# Patient Record
Sex: Female | Born: 1995 | Race: Black or African American | Hispanic: No | Marital: Single | Smoking: Current some day smoker
Health system: Southern US, Community
[De-identification: ages and names within clinical notes are randomized; demographics above are authoritative.]

## PROBLEM LIST (undated history)

## (undated) DIAGNOSIS — M419 Scoliosis, unspecified: Secondary | ICD-10-CM

## (undated) DIAGNOSIS — W3400XA Accidental discharge from unspecified firearms or gun, initial encounter: Secondary | ICD-10-CM

## (undated) HISTORY — PX: KNEE SURGERY: SHX244

## (undated) HISTORY — PX: OTHER SURGICAL HISTORY: SHX169

---
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
## 2013-02-16 ENCOUNTER — Emergency Department (HOSPITAL_BASED_OUTPATIENT_CLINIC_OR_DEPARTMENT_OTHER)
Admission: EM | Admit: 2013-02-16 | Discharge: 2013-02-16 | Disposition: A | Discharge status: Home / Self Care | Payer: Medicaid Other | Attending: Emergency Medicine | Admitting: Emergency Medicine

## 2013-02-16 ENCOUNTER — Encounter (HOSPITAL_BASED_OUTPATIENT_CLINIC_OR_DEPARTMENT_OTHER): Payer: Self-pay | Admitting: *Deleted

## 2013-02-16 DIAGNOSIS — Z8739 Personal history of other diseases of the musculoskeletal system and connective tissue: Secondary | ICD-10-CM | POA: Insufficient documentation

## 2013-02-16 DIAGNOSIS — F17200 Nicotine dependence, unspecified, uncomplicated: Secondary | ICD-10-CM | POA: Insufficient documentation

## 2013-02-16 DIAGNOSIS — J209 Acute bronchitis, unspecified: Secondary | ICD-10-CM | POA: Insufficient documentation

## 2013-02-16 DIAGNOSIS — G43909 Migraine, unspecified, not intractable, without status migrainosus: Secondary | ICD-10-CM | POA: Insufficient documentation

## 2013-02-16 DIAGNOSIS — J45901 Unspecified asthma with (acute) exacerbation: Secondary | ICD-10-CM | POA: Insufficient documentation

## 2013-02-16 DIAGNOSIS — R072 Precordial pain: Secondary | ICD-10-CM | POA: Insufficient documentation

## 2013-02-16 MED ORDER — IPRATROPIUM BROMIDE 0.02 % IN SOLN
0.5000 mg | Freq: Once | RESPIRATORY_TRACT | Status: AC
  Administered 2013-02-16: 0.5 mg via RESPIRATORY_TRACT

## 2013-02-16 MED ORDER — ALBUTEROL SULFATE (5 MG/ML) 0.5% IN NEBU
INHALATION_SOLUTION | RESPIRATORY_TRACT | Status: AC
  Filled 2013-02-16: qty 1

## 2013-02-16 MED ORDER — HYDROCOD POLST-CHLORPHEN POLST 10-8 MG/5ML PO LQCR: 5.0000 mL | Freq: Two times a day (BID) | ORAL | Status: DC | PRN

## 2013-02-16 MED ORDER — IPRATROPIUM BROMIDE 0.02 % IN SOLN
RESPIRATORY_TRACT | Status: AC
  Filled 2013-02-16: qty 2.5

## 2013-02-16 MED ORDER — NAPROXEN 250 MG PO TABS
500.0000 mg | ORAL_TABLET | Freq: Once | ORAL | Status: AC
  Administered 2013-02-16: 500 mg via ORAL
  Filled 2013-02-16: qty 2

## 2013-02-16 MED ORDER — ALBUTEROL SULFATE (5 MG/ML) 0.5% IN NEBU
5.0000 mg | INHALATION_SOLUTION | Freq: Once | RESPIRATORY_TRACT | Status: AC
  Administered 2013-02-16: 5 mg via RESPIRATORY_TRACT

## 2013-02-16 MED ORDER — HYDROCOD POLST-CHLORPHEN POLST 10-8 MG/5ML PO LQCR
5.0000 mL | Freq: Once | ORAL | Status: AC
  Administered 2013-02-16: 5 mL via ORAL
  Filled 2013-02-16: qty 5

## 2013-02-16 NOTE — ED Provider Notes (Addendum)
CSN: [id number]     Arrival date & time 02/16/13  0049 History   First MD Initiated Contact with Patient 02/16/13 0100     Chief Complaint  Patient presents with  . Cough   (Consider location/radiation/quality/duration/timing/severity/associated sxs/prior Treatment) HPI Is a 17 year old female with a history of asthma. She is here with a one-week history of cold by which she means nasal congestion, fever and cough. The cold is also exacerbated her asthma and she has had more wheezing than usual for the past 3 days. The wheezing is control by her albuterol but she is here this morning because of excessive coughing. She has not taken anything specific for the coughing. She is also complaining of bilateral parasternal chest pain.  She is also had some fluttering in her chest. She has had this in the past was placed on a cardiac monitor which revealed no arrhythmia. She is not having fluttering at the present time.  Past Medical History  Diagnosis Date  . Migraine   . Asthma   . Scoliosis    History reviewed. No pertinent past surgical history. History reviewed. No pertinent family history. History  Substance Use Topics  . Smoking status: Current Some Day Smoker  . Smokeless tobacco: Not on file  . Alcohol Use: No   OB History   Grav Para Term Preterm Abortions TAB SAB Ect Mult Living                 Review of Systems  All other systems reviewed and are negative.    Allergies  Relpax  Home Medications   Current Outpatient Rx  Name  Route  Sig  Dispense  Refill  . ALBUTEROL IN   Inhalation   Inhale into the lungs.           . Beclomethasone Dipropionate (QVAR IN)   Inhalation   Inhale into the lungs.           Marland Kitchen HYDROcodone-acetaminophen (NORCO/VICODIN) 5-325 MG per tablet   Oral   Take 2 tablets by mouth every 4 (four) hours as needed for pain.   10 tablet   0   . Topiramate (TOPAMAX PO)   Oral   Take by mouth.            BP 111/68  Pulse 60   Temp(Src) 98.2 F (36.8 C)  Resp 20  Ht 5\' 8"  (1.727 m)  Wt 128 lb (58.06 kg)  BMI 19.47 kg/m2  SpO2 100%  LMP 01/16/2013  Physical Exam General: Well-developed, well-nourished female in no acute distress; appearance consistent with age of record HENT: normocephalic; atraumatic; nasal congestion Eyes: pupils equal, round and reactive to light; extraocular muscles intact Neck: supple Heart: regular rate and rhythm; no ectopy Lungs: clear to auscultation bilaterally Chest: Bilateral parasternal rib tenderness without deformity or crepitus Abdomen: soft; nondistended Extremities: No deformity; full range of motion; pulses normal Neurologic: Awake, alert and oriented; motor function intact in all extremities and symmetric; no facial droop Skin: Warm and dry Psychiatric: Flat affect    ED Course  Procedures (including critical care time)   MDM  Given albuterol and Atrovent and ED. she was advised to use ibuprofen or Aleve for her chest wall soreness.    Hanley Seamen, MD 02/16/13 0118  Hanley Seamen, MD 02/16/13 4098

## 2013-02-16 NOTE — ED Notes (Signed)
C/o cough, rib pain, and fever x 1 week.

## 2013-03-06 ENCOUNTER — Encounter (HOSPITAL_BASED_OUTPATIENT_CLINIC_OR_DEPARTMENT_OTHER): Payer: Self-pay | Admitting: Emergency Medicine

## 2013-03-06 ENCOUNTER — Emergency Department (HOSPITAL_BASED_OUTPATIENT_CLINIC_OR_DEPARTMENT_OTHER)
Admission: EM | Admit: 2013-03-06 | Discharge: 2013-03-06 | Disposition: A | Discharge status: Home / Self Care | Payer: Medicaid Other | Attending: Emergency Medicine | Admitting: Emergency Medicine

## 2013-03-06 DIAGNOSIS — Z8739 Personal history of other diseases of the musculoskeletal system and connective tissue: Secondary | ICD-10-CM | POA: Insufficient documentation

## 2013-03-06 DIAGNOSIS — J45909 Unspecified asthma, uncomplicated: Secondary | ICD-10-CM | POA: Insufficient documentation

## 2013-03-06 DIAGNOSIS — G43909 Migraine, unspecified, not intractable, without status migrainosus: Secondary | ICD-10-CM

## 2013-03-06 DIAGNOSIS — F17200 Nicotine dependence, unspecified, uncomplicated: Secondary | ICD-10-CM | POA: Insufficient documentation

## 2013-03-06 MED ORDER — DIPHENHYDRAMINE HCL 25 MG PO CAPS
50.0000 mg | ORAL_CAPSULE | Freq: Once | ORAL | Status: AC
  Administered 2013-03-06: 50 mg via ORAL
  Filled 2013-03-06: qty 2

## 2013-03-06 MED ORDER — METOCLOPRAMIDE HCL 5 MG/ML IJ SOLN
10.0000 mg | Freq: Once | INTRAMUSCULAR | Status: AC
  Administered 2013-03-06: 10 mg via INTRAMUSCULAR
  Filled 2013-03-06: qty 2

## 2013-03-06 MED ORDER — KETOROLAC TROMETHAMINE 60 MG/2ML IM SOLN
60.0000 mg | Freq: Once | INTRAMUSCULAR | Status: AC
  Administered 2013-03-06: 60 mg via INTRAMUSCULAR
  Filled 2013-03-06: qty 2

## 2013-03-06 NOTE — ED Notes (Signed)
Patient here with right temporal headache since am. Complains of nausea, dizziness and eye discomfort for same. Denies trauma. Alert and oriented

## 2013-03-06 NOTE — ED Provider Notes (Signed)
Medical screening examination/treatment/procedure(s) were performed by non-physician practitioner and as supervising physician I was immediately available for consultation/collaboration.    Gilda Crease, MD 03/06/13 1710

## 2013-03-06 NOTE — ED Provider Notes (Signed)
CSN: [id number]     Arrival date & time 03/06/13  1354 History   First MD Initiated Contact with Patient 03/06/13 1426     Chief Complaint  Patient presents with  . Headache   (Consider location/radiation/quality/duration/timing/severity/associated sxs/prior Treatment) Patient is a 17 y.o. female presenting with headaches. The history is provided by the patient. No language interpreter was used.  Headache Pain location:  R temporal Radiates to:  Does not radiate Severity currently:  8/10 Context: bright light   Associated symptoms: nausea and photophobia   Associated symptoms: no fever and no vomiting   Associated symptoms comment:  Right temporal headache since this morning. No vomiting, fever, visual change. No pain with eye movement. She reports a history of migraine headache and is taking Relpax without relief.    Past Medical History  Diagnosis Date  . Migraine   . Asthma   . Scoliosis    History reviewed. No pertinent past surgical history. No family history on file. History  Substance Use Topics  . Smoking status: Current Some Day Smoker  . Smokeless tobacco: Not on file  . Alcohol Use: No   OB History   Grav Para Term Preterm Abortions TAB SAB Ect Mult Living                 Review of Systems  Constitutional: Negative for fever and chills.  Eyes: Positive for photophobia.  Gastrointestinal: Positive for nausea. Negative for vomiting.  Musculoskeletal: Negative.   Skin: Negative.   Neurological: Positive for headaches.    Allergies  Relpax  Home Medications   Current Outpatient Rx  Name  Route  Sig  Dispense  Refill  . ALBUTEROL IN   Inhalation   Inhale into the lungs.           . Beclomethasone Dipropionate (QVAR IN)   Inhalation   Inhale into the lungs.           . Topiramate (TOPAMAX PO)   Oral   Take by mouth.            BP 106/64  Pulse 51  Temp(Src) 98 F (36.7 C) (Oral)  Resp 18  Ht 5\' 8"  (1.727 m)  Wt 129 lb (58.514 kg)   BMI 19.62 kg/m2  LMP 02/21/2013 Physical Exam  Constitutional: She is oriented to person, place, and time. She appears well-developed and well-nourished.  HENT:  Head: Normocephalic.  Eyes: Pupils are equal, round, and reactive to light.  Neck: Normal range of motion. Neck supple.  Cardiovascular: Normal rate and regular rhythm.   Pulmonary/Chest: Effort normal and breath sounds normal.  Abdominal: Soft. Bowel sounds are normal. There is no tenderness. There is no rebound and no guarding.  Musculoskeletal: Normal range of motion.  Neurological: She is alert and oriented to person, place, and time. She has normal strength and normal reflexes. No sensory deficit. She displays a negative Romberg sign. Coordination normal.  Cranial nerves 3-12 grossly intact. Ambulatory without difficulty.  Skin: Skin is warm and dry. No rash noted.  Psychiatric: She has a normal mood and affect.    ED Course  Procedures (including critical care time) Labs Review Labs Reviewed - No data to display Imaging Review No results found.  EKG Interpretation   None       MDM  No diagnosis found. 1. Migraine headache  Patient appears stable, texting on phone, NAD. VSS. Headache cocktail given. Stable for discharge.     Arnoldo Hooker, PA-C 03/06/13 1539

## 2015-09-11 ENCOUNTER — Encounter (HOSPITAL_BASED_OUTPATIENT_CLINIC_OR_DEPARTMENT_OTHER): Payer: Self-pay | Admitting: *Deleted

## 2015-09-11 ENCOUNTER — Emergency Department (HOSPITAL_BASED_OUTPATIENT_CLINIC_OR_DEPARTMENT_OTHER): Payer: Self-pay

## 2015-09-11 ENCOUNTER — Emergency Department (HOSPITAL_BASED_OUTPATIENT_CLINIC_OR_DEPARTMENT_OTHER)
Admission: EM | Admit: 2015-09-11 | Discharge: 2015-09-11 | Disposition: A | Discharge status: Home / Self Care | Payer: Self-pay | Attending: Emergency Medicine | Admitting: Emergency Medicine

## 2015-09-11 DIAGNOSIS — Y9389 Activity, other specified: Secondary | ICD-10-CM | POA: Insufficient documentation

## 2015-09-11 DIAGNOSIS — Y998 Other external cause status: Secondary | ICD-10-CM | POA: Insufficient documentation

## 2015-09-11 DIAGNOSIS — Y92410 Unspecified street and highway as the place of occurrence of the external cause: Secondary | ICD-10-CM | POA: Insufficient documentation

## 2015-09-11 DIAGNOSIS — S0990XA Unspecified injury of head, initial encounter: Secondary | ICD-10-CM

## 2015-09-11 DIAGNOSIS — M419 Scoliosis, unspecified: Secondary | ICD-10-CM | POA: Insufficient documentation

## 2015-09-11 DIAGNOSIS — Z8679 Personal history of other diseases of the circulatory system: Secondary | ICD-10-CM | POA: Insufficient documentation

## 2015-09-11 DIAGNOSIS — F17210 Nicotine dependence, cigarettes, uncomplicated: Secondary | ICD-10-CM | POA: Insufficient documentation

## 2015-09-11 DIAGNOSIS — J45909 Unspecified asthma, uncomplicated: Secondary | ICD-10-CM | POA: Insufficient documentation
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
## 2015-09-11 NOTE — Discharge Instructions (Signed)
Tylenol 1000 mg rotated with Motrin 600 mg every 4 hours as needed for pain.  Follow-up with your primary Dr. if not improving in the next week.   Concussion, Adult A concussion, or closed-head injury, is a brain injury caused by a direct blow to the head or by a quick and sudden movement (jolt) of the head or neck. Concussions are usually not life-threatening. Even so, the effects of a concussion can be serious. If you have had a concussion before, you are more likely to experience concussion-like symptoms after a direct blow to the head.  CAUSES  Direct blow to the head, such as from running into another player during a soccer game, being hit in a fight, or hitting your head on a hard surface.  A jolt of the head or neck that causes the brain to move back and forth inside the skull, such as in a car crash. SIGNS AND SYMPTOMS The signs of a concussion can be hard to notice. Early on, they may be missed by you, family members, and health care providers. You may look fine but act or feel differently. Symptoms are usually temporary, but they may last for days, weeks, or even longer. Some symptoms may appear right away while others may not show up for hours or days. Every head injury is different. Symptoms include:  Mild to moderate headaches that will not go away.  A feeling of pressure inside your head.  Having more trouble than usual:  Learning or remembering things you have heard.  Answering questions.  Paying attention or concentrating.  Organizing daily tasks.  Making decisions and solving problems.  Slowness in thinking, acting or reacting, speaking, or reading.  Getting lost or being easily confused.  Feeling tired all the time or lacking energy (fatigued).  Feeling drowsy.  Sleep disturbances.  Sleeping more than usual.  Sleeping less than usual.  Trouble falling asleep.  Trouble sleeping (insomnia).  Loss of balance or feeling lightheaded or dizzy.  Nausea  or vomiting.  Numbness or tingling.  Increased sensitivity to:  Sounds.  Lights.  Distractions.  Vision problems or eyes that tire easily.  Diminished sense of taste or smell.  Ringing in the ears.  Mood changes such as feeling sad or anxious.  Becoming easily irritated or angry for little or no reason.  Lack of motivation.  Seeing or hearing things other people do not see or hear (hallucinations). DIAGNOSIS Your health care provider can usually diagnose a concussion based on a description of your injury and symptoms. He or she will ask whether you passed out (lost consciousness) and whether you are having trouble remembering events that happened right before and during your injury. Your evaluation might include:  A brain scan to look for signs of injury to the brain. Even if the test shows no injury, you may still have a concussion.  Blood tests to be sure other problems are not present. TREATMENT  Concussions are usually treated in an emergency department, in urgent care, or at a clinic. You may need to stay in the hospital overnight for further treatment.  Tell your health care provider if you are taking any medicines, including prescription medicines, over-the-counter medicines, and natural remedies. Some medicines, such as blood thinners (anticoagulants) and aspirin, may increase the chance of complications. Also tell your health care provider whether you have had alcohol or are taking illegal drugs. This information may affect treatment.  Your health care provider will send you home with important instructions  to follow.  How fast you will recover from a concussion depends on many factors. These factors include how severe your concussion is, what part of your brain was injured, your age, and how healthy you were before the concussion.  Most people with mild injuries recover fully. Recovery can take time. In general, recovery is slower in older persons. Also, persons  who have had a concussion in the past or have other medical problems may find that it takes longer to recover from their current injury. HOME CARE INSTRUCTIONS General Instructions  Carefully follow the directions your health care provider gave you.  Only take over-the-counter or prescription medicines for pain, discomfort, or fever as directed by your health care provider.  Take only those medicines that your health care provider has approved.  Do not drink alcohol until your health care provider says you are well enough to do so. Alcohol and certain other drugs may slow your recovery and can put you at risk of further injury.  If it is harder than usual to remember things, write them down.  If you are easily distracted, try to do one thing at a time. For example, do not try to watch TV while fixing dinner.  Talk with family members or close friends when making important decisions.  Keep all follow-up appointments. Repeated evaluation of your symptoms is recommended for your recovery.  Watch your symptoms and tell others to do the same. Complications sometimes occur after a concussion. Older adults with a brain injury may have a higher risk of serious complications, such as a blood clot on the brain.  Tell your teachers, school nurse, school counselor, coach, athletic trainer, or work Production designer, theatre/television/film about your injury, symptoms, and restrictions. Tell them about what you can or cannot do. They should watch for:  Increased problems with attention or concentration.  Increased difficulty remembering or learning new information.  Increased time needed to complete tasks or assignments.  Increased irritability or decreased ability to cope with stress.  Increased symptoms.  Rest. Rest helps the brain to heal. Make sure you:  Get plenty of sleep at night. Avoid staying up late at night.  Keep the same bedtime hours on weekends and weekdays.  Rest during the day. Take daytime naps or rest  breaks when you feel tired.  Limit activities that require a lot of thought or concentration. These include:  Doing homework or job-related work.  Watching TV.  Working on the computer.  Avoid any situation where there is potential for another head injury (football, hockey, soccer, basketball, martial arts, downhill snow sports and horseback riding). Your condition will get worse every time you experience a concussion. You should avoid these activities until you are evaluated by the appropriate follow-up health care providers. Returning To Your Regular Activities You will need to return to your normal activities slowly, not all at once. You must give your body and brain enough time for recovery.  Do not return to sports or other athletic activities until your health care provider tells you it is safe to do so.  Ask your health care provider when you can drive, ride a bicycle, or operate heavy machinery. Your ability to react may be slower after a brain injury. Never do these activities if you are dizzy.  Ask your health care provider about when you can return to work or school. Preventing Another Concussion It is very important to avoid another brain injury, especially before you have recovered. In rare cases, another injury can  lead to permanent brain damage, brain swelling, or death. The risk of this is greatest during the first 7-10 days after a head injury. Avoid injuries by:  Wearing a seat belt when riding in a car.  Drinking alcohol only in moderation.  Wearing a helmet when biking, skiing, skateboarding, skating, or doing similar activities.  Avoiding activities that could lead to a second concussion, such as contact or recreational sports, until your health care provider says it is okay.  Taking safety measures in your home.  Remove clutter and tripping hazards from floors and stairways.  Use grab bars in bathrooms and handrails by stairs.  Place non-slip mats on floors  and in bathtubs.  Improve lighting in dim areas. SEEK MEDICAL CARE IF:  You have increased problems paying attention or concentrating.  You have increased difficulty remembering or learning new information.  You need more time to complete tasks or assignments than before.  You have increased irritability or decreased ability to cope with stress.  You have more symptoms than before. Seek medical care if you have any of the following symptoms for more than 2 weeks after your injury:  Lasting (chronic) headaches.  Dizziness or balance problems.  Nausea.  Vision problems.  Increased sensitivity to noise or light.  Depression or mood swings.  Anxiety or irritability.  Memory problems.  Difficulty concentrating or paying attention.  Sleep problems.  Feeling tired all the time. SEEK IMMEDIATE MEDICAL CARE IF:  You have severe or worsening headaches. These may be a sign of a blood clot in the brain.  You have weakness (even if only in one hand, leg, or part of the face).  You have numbness.  You have decreased coordination.  You vomit repeatedly.  You have increased sleepiness.  One pupil is larger than the other.  You have convulsions.  You have slurred speech.  You have increased confusion. This may be a sign of a blood clot in the brain.  You have increased restlessness, agitation, or irritability.  You are unable to recognize people or places.  You have neck pain.  It is difficult to wake you up.  You have unusual behavior changes.  You lose consciousness. MAKE SURE YOU:  Understand these instructions.  Will watch your condition.  Will get help right away if you are not doing well or get worse.   This information is not intended to replace advice given to you by your health care provider. Make sure you discuss any questions you have with your health care provider.   Document Released: 08/02/2003 Document Revised: 06/02/2014 Document  Reviewed: 12/02/2012 Elsevier Interactive Patient Education [employer]2016 [employer]
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
## 2017-02-11 ENCOUNTER — Emergency Department (HOSPITAL_BASED_OUTPATIENT_CLINIC_OR_DEPARTMENT_OTHER)
Admission: EM | Admit: 2017-02-11 | Discharge: 2017-02-11 | Disposition: A | Discharge status: Home / Self Care | Payer: Self-pay | Attending: Emergency Medicine | Admitting: Emergency Medicine

## 2017-02-11 ENCOUNTER — Encounter (HOSPITAL_BASED_OUTPATIENT_CLINIC_OR_DEPARTMENT_OTHER): Payer: Self-pay

## 2017-02-11 DIAGNOSIS — J45909 Unspecified asthma, uncomplicated: Secondary | ICD-10-CM | POA: Insufficient documentation

## 2017-02-11 DIAGNOSIS — M542 Cervicalgia: Secondary | ICD-10-CM | POA: Insufficient documentation

## 2017-02-11 DIAGNOSIS — M79671 Pain in right foot: Secondary | ICD-10-CM | POA: Insufficient documentation

## 2017-02-11 DIAGNOSIS — F17210 Nicotine dependence, cigarettes, uncomplicated: Secondary | ICD-10-CM | POA: Insufficient documentation

## 2017-02-11 MED ORDER — IBUPROFEN 400 MG PO TABS
600.0000 mg | ORAL_TABLET | Freq: Once | ORAL | Status: AC
  Administered 2017-02-11: 600 mg via ORAL
  Filled 2017-02-11: qty 1

## 2017-02-11 NOTE — ED Triage Notes (Signed)
C/o right foot pain and "crick in my neck"-both c/o x 3 days-denies injuries-NAD-steady gait

## 2017-02-11 NOTE — ED Provider Notes (Signed)
Pt seen and evaluated. D/W Dr. Wonda Olds. Pt examined. Has low arch. TTP plantar at First MTP, and along fascia to mid MT. Likely Plantar Fasciitis. Pt works Engineering geologist. Shoes have nearly no arch. Recommend increasing size of arch as tolerated, and PEN NSAIDa and p work.   Rolland Porter, MD 02/11/17 2249

## 2017-02-11 NOTE — ED Provider Notes (Signed)
[facility] Provider Note   CSN: [id number] Arrival date & time: 02/11/17  2015  History   Chief Complaint Chief Complaint  Patient presents with  . Foot Pain    HPI Jade Wood is a 21 y.o. female presenting with right foot pain and neck pain for the past 5 days. She woke up Friday morning and got out of bed, once she put weight on her right foot she had pain on the bottom side under first toe joint. She thought it looked swollen compared to right. Works at [employer] a lot during day. She recently bought new shoes a size bigger to be more comfortable. She also noticed neck pain worse with turning her head certain ways. She got a massage last night with some relief. She does not like to take medicine so has not tried any OTC pain relief. Has not tried any ice or heat either. No fevers or chills, no recent illnesses, denies injury or trauma to area.   HPI  Past Medical History:  Diagnosis Date  . Asthma   . Migraine   . Scoliosis     There are no active problems to display for this patient.   History reviewed. No pertinent surgical history.  OB History    No data available       Home Medications    Prior to Admission medications   Not on File    Family History No family history on file.  Social History Social History  Substance Use Topics  . Smoking status: Current Some Day Smoker    Packs/day: 0.50    Types: Cigarettes  . Smokeless tobacco: Never Used  . Alcohol use Yes     Comment: occ     Allergies   Relpax [eletriptan]   Review of Systems Review of Systems  Constitutional: Negative for appetite change, chills and fever.  HENT: Negative for congestion, sinus pain and sore throat.   Eyes: Negative for visual disturbance.  Respiratory: Negative for shortness of breath.   Cardiovascular: Negative for chest pain and leg swelling.  Gastrointestinal: Negative for abdominal pain, constipation, diarrhea, nausea and vomiting.    Genitourinary: Negative for dysuria.  Musculoskeletal: Positive for joint swelling and neck pain. Negative for back pain, myalgias and neck stiffness.  Skin: Negative for rash and wound.  Neurological: Negative for dizziness, weakness, numbness and headaches.     Physical Exam Updated Vital Signs BP 113/84 (BP Location: Left Arm)   Pulse 78   Temp 98.4 F (36.9 C) (Oral)   Resp 16   Ht  (1.753 m)   Wt 56.7 kg (125 lb)   LMP 02/01/2017   SpO2 99%   BMI 18.46 kg/m   Physical Exam  Constitutional: She is oriented to person, place, and time. She appears well-developed and well-nourished. No distress.  HENT:  Head: Normocephalic and atraumatic.  Mouth/Throat: Oropharynx is clear and moist.  Eyes: Pupils are equal, round, and reactive to light. EOM are normal.  Neck: Neck supple. Muscular tenderness present. No spinous process tenderness present. No neck rigidity. Decreased range of motion present. No edema and no erythema present.  Cardiovascular: Normal rate and regular rhythm.   Pulmonary/Chest: Effort normal and breath sounds normal. No respiratory distress.  Abdominal: Soft. There is no tenderness.  Musculoskeletal: She exhibits tenderness.       Right foot: There is normal range of motion and no deformity (flat feet).       Left foot: There is  normal range of motion and no deformity.  Pes planus bilaterally  Feet:  Right Foot:  Skin Integrity: Negative for erythema, warmth or callus.  Left Foot:  Skin Integrity: Negative for erythema, warmth or callus.  Lymphadenopathy:    She has no cervical adenopathy.  Neurological: She is alert and oriented to person, place, and time. She exhibits normal muscle tone.  Skin: Skin is warm and dry. No rash noted.  Psychiatric: She has a normal mood and affect. Her behavior is normal. Judgment and thought content normal.     ED Treatments / Results  Labs (all labs ordered are listed, but only abnormal results are  displayed) Labs Reviewed - No data to display  EKG  EKG Interpretation None       Radiology No results found.  Procedures Procedures (including critical care time)  Medications Ordered in ED Medications  ibuprofen (ADVIL,MOTRIN) tablet 600 mg (600 mg Oral Given 02/11/17 2143)     Initial Impression / Assessment and Plan / ED Course  I have reviewed the triage vital signs and the nursing notes.  Pertinent labs & imaging results that were available during my care of the patient were reviewed by me and considered in my medical decision making (see chart for details).     Well appearing 21 year old presenting with right foot pain and neck pain. Foot pain likely secondary to poor arch support. Recommended arch support, ibuprofen for pain. Neck pain MSK in nature, no red flags. Advised stretches and ibuprofen for pain. Follow up if symptoms worsen or patient develops joint swelling or fever. Stable for discharge home. Patient verbalized understanding and agreement with plan.   Final Clinical Impressions(s) / ED Diagnoses   Final diagnoses:  Foot pain, right  Neck pain, musculoskeletal    New Prescriptions New Prescriptions   No medications on file     Tillman Sers, DO 02/11/17 2250    Rolland Porter, MD 02/23/17 2212
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
## 2017-11-27 ENCOUNTER — Encounter (HOSPITAL_BASED_OUTPATIENT_CLINIC_OR_DEPARTMENT_OTHER): Payer: Self-pay | Admitting: *Deleted

## 2017-11-27 ENCOUNTER — Emergency Department (HOSPITAL_BASED_OUTPATIENT_CLINIC_OR_DEPARTMENT_OTHER)
Admission: EM | Admit: 2017-11-27 | Discharge: 2017-11-28 | Disposition: A | Discharge status: Home / Self Care | Payer: Self-pay | Attending: Emergency Medicine | Admitting: Emergency Medicine

## 2017-11-27 ENCOUNTER — Other Ambulatory Visit: Payer: Self-pay

## 2017-11-27 DIAGNOSIS — F17210 Nicotine dependence, cigarettes, uncomplicated: Secondary | ICD-10-CM | POA: Insufficient documentation

## 2017-11-27 DIAGNOSIS — M419 Scoliosis, unspecified: Secondary | ICD-10-CM | POA: Insufficient documentation

## 2017-11-27 DIAGNOSIS — J45909 Unspecified asthma, uncomplicated: Secondary | ICD-10-CM | POA: Insufficient documentation

## 2017-11-27 DIAGNOSIS — R0789 Other chest pain: Secondary | ICD-10-CM | POA: Insufficient documentation

## 2017-11-27 NOTE — ED Triage Notes (Signed)
Pt c/o right breast pain x 1 week

## 2017-11-27 NOTE — ED Notes (Signed)
ED Provider at bedside. Nurse at bedside for chaperone.

## 2017-11-28 ENCOUNTER — Emergency Department (HOSPITAL_BASED_OUTPATIENT_CLINIC_OR_DEPARTMENT_OTHER): Payer: Self-pay

## 2017-11-28 NOTE — ED Provider Notes (Signed)
[facility] EMERGENCY DEPARTMENT Provider Note   CSN: [id number] Arrival date & time: 11/27/17  2332     History   Chief Complaint Chief Complaint  Patient presents with  . Breast Pain    HPI Jade Wood is a 22 y.o. female.  22 yo F with a chief complaint of right breast pain.  This been going on for the past couple days.  Denies trauma.  Pain is worse with movement palpation.  She feels that it hurts when she walks down the street because the gravity of her breast moving make her have pain.  She denies shortness of breath denies fevers denies vomiting.  The history is provided by the patient.  Illness  This is a new problem. The current episode started yesterday. The problem occurs constantly. The problem has not changed since onset.Associated symptoms include chest pain. Pertinent negatives include no headaches and no shortness of breath. Nothing aggravates the symptoms. Nothing relieves the symptoms. She has tried nothing for the symptoms.    Past Medical History:  Diagnosis Date  . Asthma   . Migraine   . Scoliosis     There are no active problems to display for this patient.   History reviewed. No pertinent surgical history.   OB History   None      Home Medications    Prior to Admission medications   Not on File    Family History History reviewed. No pertinent family history.  Social History Social History   Tobacco Use  . Smoking status: Current Some Day Smoker    Packs/day: 0.50    Types: Cigarettes  . Smokeless tobacco: Never Used  Substance Use Topics  . Alcohol use: Yes    Comment: occ  . Drug use: No     Allergies   Relpax [eletriptan]   Review of Systems Review of Systems  Constitutional: Negative for chills and fever.  HENT: Negative for congestion and rhinorrhea.   Eyes: Negative for redness and visual disturbance.  Respiratory: Negative for shortness of breath and wheezing.   Cardiovascular: Positive for  chest pain. Negative for palpitations.  Gastrointestinal: Negative for nausea and vomiting.  Genitourinary: Negative for dysuria and urgency.  Musculoskeletal: Negative for arthralgias and myalgias.  Skin: Negative for pallor and wound.  Neurological: Negative for dizziness and headaches.     Physical Exam Updated Vital Signs BP 109/68 (BP Location: Right Arm)   Pulse 67   Resp 14   Ht 5\' 10"  (1.778 m)   Wt 56.7 kg (125 lb)   LMP 10/29/2017   SpO2 98%   BMI 17.94 kg/m   Physical Exam  Constitutional: She is oriented to person, place, and time. She appears well-developed and well-nourished. No distress.  HENT:  Head: Normocephalic and atraumatic.  Eyes: Pupils are equal, round, and reactive to light. EOM are normal.  Neck: Normal range of motion. Neck supple.  Cardiovascular: Normal rate and regular rhythm. Exam reveals no gallop and no friction rub.  No murmur heard. Pulmonary/Chest: Effort normal. She has no wheezes. She has no rales. She exhibits tenderness.  Pain with palpation of the right sternal border about ribs 3 and 4.  Normal feeling breast tissue to the lateral aspect of the right breast.  Abdominal: Soft. She exhibits no distension and no mass. There is no tenderness. There is no guarding.  Musculoskeletal: She exhibits no edema or tenderness.  Neurological: She is alert and oriented to person, place, and time.  Skin: Skin  is warm and dry. She is not diaphoretic.  Psychiatric: She has a normal mood and affect. Her behavior is normal.  Nursing note and vitals reviewed.    ED Treatments / Results  Labs (all labs ordered are listed, but only abnormal results are displayed) Labs Reviewed - No data to display  EKG EKG Interpretation  Date/Time:  Saturday November 28 2017 00:02:47 EDT Ventricular Rate:  60 PR Interval:    QRS Duration: 94 QT Interval:  417 QTC Calculation: 417 R Axis:   93 Text Interpretation:  Sinus rhythm Borderline right axis deviation t  wave amplitude has decreased diffusely Otherwise no significant change Confirmed by Melene Plan [phone]) on 11/28/2017 12:49:48 AM   Radiology Dg Chest 2 View  Result Date: 11/28/2017 CLINICAL DATA:  Right-sided chest pain EXAM: CHEST - 2 VIEW COMPARISON:  05/11/2013 FINDINGS: The heart size and mediastinal contours are within normal limits. Both lungs are clear. Mild scoliosis of the spine IMPRESSION: No active cardiopulmonary disease. Electronically Signed   By: Jasmine Pang M.D.   On: 11/28/2017 01:01    Procedures Procedures (including critical care time)  Medications Ordered in ED Medications - No data to display   Initial Impression / Assessment and Plan / ED Course  I have reviewed the triage vital signs and the nursing notes.  Pertinent labs & imaging results that were available during my care of the patient were reviewed by me and considered in my medical decision making (see chart for details).     22 yo F with right-sided chest wall pain.  The patient is concerned that she has pain in her breast but seems more muscular on my exam.  CXR without focal infiltrate on pneumothorax.  ECG without concerning finding. We will have her do Tylenol and NSAIDs at home.  PCP follow-up.  2:55 AM:  I have discussed the diagnosis/risks/treatment options with the patient and believe the pt to be eligible for discharge home to follow-up with PCP. We also discussed returning to the ED immediately if new or worsening sx occur. We discussed the sx which are most concerning (e.g., sudden worsening pain, fever, inability to tolerate by mouth) that necessitate immediate return. Medications administered to the patient during their visit and any new prescriptions provided to the patient are listed below.  Medications given during this visit Medications - No data to display    The patient appears reasonably screen and/or stabilized for discharge and I doubt any other medical condition or other [facility]  requiring further screening, evaluation, or treatment in the ED at this time prior to discharge.    Final Clinical Impressions(s) / ED Diagnoses   Final diagnoses:  Chest wall pain    ED Discharge Orders    None       Melene Plan, DO 11/28/17 1914

## 2017-11-28 NOTE — Discharge Instructions (Signed)
Your ekg and chest xray were normal.  Please follow up with your family doc or obgyn.   Take 4 over the counter ibuprofen tablets 3 times a day or 2 over-the-counter naproxen tablets twice a day for pain. Also take tylenol 1000mg (2 extra strength) four times a day.

## 2019-01-14 ENCOUNTER — Other Ambulatory Visit: Payer: Self-pay

## 2019-01-14 DIAGNOSIS — Z20822 Contact with and (suspected) exposure to covid-19: Secondary | ICD-10-CM

## 2019-01-15 LAB — NOVEL CORONAVIRUS, NAA: SARS-CoV-2, NAA: NOT DETECTED

## 2023-06-21 ENCOUNTER — Encounter (HOSPITAL_BASED_OUTPATIENT_CLINIC_OR_DEPARTMENT_OTHER): Payer: Self-pay | Admitting: Emergency Medicine

## 2023-06-21 ENCOUNTER — Emergency Department (HOSPITAL_BASED_OUTPATIENT_CLINIC_OR_DEPARTMENT_OTHER): Payer: Self-pay

## 2023-06-21 ENCOUNTER — Emergency Department (HOSPITAL_BASED_OUTPATIENT_CLINIC_OR_DEPARTMENT_OTHER)
Admission: EM | Admit: 2023-06-21 | Discharge: 2023-06-21 | Disposition: A | Discharge status: Home / Self Care | Payer: Self-pay | Attending: Emergency Medicine | Admitting: Emergency Medicine

## 2023-06-21 ENCOUNTER — Other Ambulatory Visit: Payer: Self-pay

## 2023-06-21 DIAGNOSIS — T148XXA Other injury of unspecified body region, initial encounter: Secondary | ICD-10-CM

## 2023-06-21 DIAGNOSIS — W25XXXA Contact with sharp glass, initial encounter: Secondary | ICD-10-CM | POA: Insufficient documentation

## 2023-06-21 DIAGNOSIS — Z23 Encounter for immunization: Secondary | ICD-10-CM | POA: Insufficient documentation

## 2023-06-21 DIAGNOSIS — S61411A Laceration without foreign body of right hand, initial encounter: Secondary | ICD-10-CM | POA: Insufficient documentation

## 2023-06-21 HISTORY — DX: Accidental discharge from unspecified firearms or gun, initial encounter: W34.00XA

## 2023-06-21 MED ORDER — MELOXICAM 7.5 MG PO TABS: 7.5000 mg | ORAL_TABLET | Freq: Every day | ORAL | 0 refills | Status: AC

## 2023-06-21 MED ORDER — TETANUS-DIPHTH-ACELL PERTUSSIS 5-2.5-18.5 LF-MCG/0.5 IM SUSY
0.5000 mL | PREFILLED_SYRINGE | Freq: Once | INTRAMUSCULAR | Status: AC
  Administered 2023-06-21: 0.5 mL via INTRAMUSCULAR
  Filled 2023-06-21: qty 0.5

## 2023-06-21 MED ORDER — KETOROLAC TROMETHAMINE 60 MG/2ML IM SOLN
30.0000 mg | Freq: Once | INTRAMUSCULAR | Status: AC
  Administered 2023-06-21: 30 mg via INTRAMUSCULAR
  Filled 2023-06-21: qty 2

## 2023-06-21 MED ORDER — CEPHALEXIN 500 MG PO CAPS
500.0000 mg | ORAL_CAPSULE | Freq: Four times a day (QID) | ORAL | 0 refills | Status: AC
Start: 2023-06-21 — End: ?

## 2023-06-21 MED ORDER — CEPHALEXIN 250 MG PO CAPS
500.0000 mg | ORAL_CAPSULE | Freq: Once | ORAL | Status: AC
  Administered 2023-06-21: 500 mg via ORAL
  Filled 2023-06-21: qty 2

## 2023-06-21 MED ORDER — LIDOCAINE-EPINEPHRINE (PF) 2 %-1:200000 IJ SOLN
20.0000 mL | Freq: Once | INTRAMUSCULAR | Status: DC
  Filled 2023-06-21: qty 20
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
## 2023-07-04 ENCOUNTER — Ambulatory Visit: Admit: 2023-07-04 | Payer: Self-pay | Source: Home / Self Care

## 2023-07-12 ENCOUNTER — Emergency Department (HOSPITAL_COMMUNITY): Payer: Self-pay

## 2023-07-12 ENCOUNTER — Emergency Department (HOSPITAL_COMMUNITY)
Admission: EM | Admit: 2023-07-12 | Discharge: 2023-07-12 | Disposition: A | Discharge status: Home / Self Care | Payer: Self-pay | Attending: Emergency Medicine | Admitting: Emergency Medicine

## 2023-07-12 DIAGNOSIS — Y92411 Interstate highway as the place of occurrence of the external cause: Secondary | ICD-10-CM | POA: Insufficient documentation

## 2023-07-12 DIAGNOSIS — S161XXA Strain of muscle, fascia and tendon at neck level, initial encounter: Secondary | ICD-10-CM | POA: Diagnosis not present

## 2023-07-12 DIAGNOSIS — S20219A Contusion of unspecified front wall of thorax, initial encounter: Secondary | ICD-10-CM

## 2023-07-12 DIAGNOSIS — M542 Cervicalgia: Secondary | ICD-10-CM | POA: Diagnosis present

## 2023-07-12 MED ORDER — IBUPROFEN 800 MG PO TABS: 800.0000 mg | ORAL_TABLET | Freq: Three times a day (TID) | ORAL | 0 refills | Status: AC | PRN

## 2023-07-12 MED ORDER — METHOCARBAMOL 500 MG PO TABS: 500.0000 mg | ORAL_TABLET | Freq: Four times a day (QID) | ORAL | 0 refills | Status: AC

## 2023-07-12 NOTE — Discharge Instructions (Addendum)
Return if any problems.  Follow up with your Physician for recheck  
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
## 2023-07-17 ENCOUNTER — Encounter (HOSPITAL_BASED_OUTPATIENT_CLINIC_OR_DEPARTMENT_OTHER): Payer: Self-pay | Admitting: Emergency Medicine

## 2023-07-17 ENCOUNTER — Other Ambulatory Visit: Payer: Self-pay

## 2023-07-17 ENCOUNTER — Emergency Department (HOSPITAL_BASED_OUTPATIENT_CLINIC_OR_DEPARTMENT_OTHER)
Admission: EM | Admit: 2023-07-17 | Discharge: 2023-07-17 | Disposition: A | Discharge status: Home / Self Care | Payer: Self-pay | Attending: Emergency Medicine | Admitting: Emergency Medicine

## 2023-07-17 ENCOUNTER — Emergency Department (HOSPITAL_BASED_OUTPATIENT_CLINIC_OR_DEPARTMENT_OTHER): Payer: Self-pay

## 2023-07-17 DIAGNOSIS — M25512 Pain in left shoulder: Secondary | ICD-10-CM | POA: Insufficient documentation

## 2023-07-17 DIAGNOSIS — Y92410 Unspecified street and highway as the place of occurrence of the external cause: Secondary | ICD-10-CM | POA: Diagnosis not present

## 2023-07-17 DIAGNOSIS — G8911 Acute pain due to trauma: Secondary | ICD-10-CM | POA: Diagnosis not present

## 2023-07-17 DIAGNOSIS — M25561 Pain in right knee: Secondary | ICD-10-CM | POA: Diagnosis not present

## 2023-07-17 DIAGNOSIS — M898X1 Other specified disorders of bone, shoulder: Secondary | ICD-10-CM

## 2023-07-17 MED ORDER — TRAMADOL HCL 50 MG PO TABS: 50.0000 mg | ORAL_TABLET | Freq: Three times a day (TID) | ORAL | 0 refills | Status: AC | PRN

## 2023-07-17 MED ORDER — IBUPROFEN 400 MG PO TABS
400.0000 mg | ORAL_TABLET | Freq: Once | ORAL | Status: AC
Start: 2023-07-17 — End: 2023-07-17
  Administered 2023-07-17: 400 mg via ORAL
  Filled 2023-07-17: qty 1

## 2023-07-17 MED ORDER — ACETAMINOPHEN 325 MG PO TABS
650.0000 mg | ORAL_TABLET | Freq: Once | ORAL | Status: AC
  Administered 2023-07-17: 650 mg via ORAL
  Filled 2023-07-17: qty 2

## 2023-07-17 NOTE — Discharge Instructions (Addendum)
It was our pleasure to provide your ER care today - we hope that you feel better.  Take acetaminophen or ibuprofen as need for pain. You may also take ultram as need for pain - no driving when taking.   Follow up with primary care doctor in 1-2 weeks if symptoms fail to improve/resolve.  Return to ER if worse, new symptoms, new/severe pain, trouble breathing, or other emergency concern.
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
## 2023-07-17 NOTE — ED Notes (Signed)
 Patient transported to X-ray
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
## 2023-07-17 NOTE — ED Notes (Signed)
